# Patient Record
Sex: Female | Born: 1955 | Race: White | Hispanic: No | Marital: Married | Smoking: Never smoker
Health system: Southern US, Community
[De-identification: ages and names within clinical notes are randomized; demographics above are authoritative.]

## PROBLEM LIST (undated history)

## (undated) DIAGNOSIS — Z1231 Encounter for screening mammogram for malignant neoplasm of breast: Secondary | ICD-10-CM

---

## 2020-09-24 ENCOUNTER — Other Ambulatory Visit: Payer: Self-pay

## 2020-09-24 ENCOUNTER — Ambulatory Visit (INDEPENDENT_AMBULATORY_CARE_PROVIDER_SITE_OTHER): Payer: Medicare Other

## 2020-09-24 ENCOUNTER — Encounter: Payer: Self-pay | Admitting: Family Medicine

## 2020-09-24 ENCOUNTER — Ambulatory Visit (INDEPENDENT_AMBULATORY_CARE_PROVIDER_SITE_OTHER): Payer: Medicare Other | Admitting: Family Medicine

## 2020-09-24 ENCOUNTER — Ambulatory Visit: Payer: Self-pay

## 2020-09-24 VITALS — BP 114/72 | HR 63 | Ht 67.0 in | Wt 155.0 lb

## 2020-09-24 DIAGNOSIS — M25561 Pain in right knee: Secondary | ICD-10-CM | POA: Diagnosis not present

## 2020-09-24 DIAGNOSIS — S83241A Other tear of medial meniscus, current injury, right knee, initial encounter: Secondary | ICD-10-CM | POA: Diagnosis not present

## 2020-09-24 DIAGNOSIS — G8929 Other chronic pain: Secondary | ICD-10-CM | POA: Diagnosis not present

## 2020-09-24 NOTE — Progress Notes (Signed)
[facility] [address] [place] [address] Phone: [phone] Subjective:   I Jody Sutton am serving as a Neurosurgeon for Dr. Antoine Sutton.  This visit occurred during the SARS-CoV-2 public health emergency.  Safety protocols were in place, including screening questions prior to the visit, additional usage of staff PPE, and extensive cleaning of exam room while observing appropriate contact time as indicated for disinfecting solutions.   I'm seeing this patient by the request  of:  Jody Course, PA-C  CC: Right knee pain  [id number]  Jody Sutton is a 65 y.o. female coming in with complaint of R knee pain. Patient states she was playing tennis when she landed hard on the right leg.   Onset- April Location - medial Duration-  Character- sharp Aggravating factors- worse with activity  Reliving factors-  Therapies tried- PT and other modalities  Severity- 8/10     No past medical history on file.  Social History   Socioeconomic History   Marital status: Married    Spouse name: Not on file   Number of children: Not on file   Years of education: Not on file   Highest education level: Not on file  Occupational History   Not on file  Tobacco Use   Smoking status: Not on file   Smokeless tobacco: Not on file  Substance and Sexual Activity   Alcohol use: Not on file   Drug use: Not on file   Sexual activity: Not on file  Other Topics Concern   Not on file  Social History Narrative   Not on file   Social Determinants of Health   Financial Resource Strain: Not on file  Food Insecurity: Not on file  Transportation Needs: Not on file  Physical Activity: Not on file  Stress: Not on file  Social Connections: Not on file   Not on File No family history on file. No current outpatient medications on file.   Reviewed prior external information including notes and imaging from  primary care provider As well as notes  that were available from care everywhere and other healthcare systems.  Past medical history, social, surgical and family history all reviewed in electronic medical record.  No pertanent information unless stated regarding to the chief complaint.   Review of Systems:  No headache, visual changes, nausea, vomiting, diarrhea, constipation, dizziness, abdominal pain, skin rash, fevers, chills, night sweats, weight loss, swollen lymph nodes, body aches, joint swelling, chest pain, shortness of breath, mood changes. POSITIVE muscle aches  Objective  Blood pressure 114/72, pulse 63, height 5\' 7"  (1.702 m), weight 155 lb (70.3 kg), SpO2 100 %.   General: No apparent distress alert and oriented x3 mood and affect normal, dressed appropriately.  HEENT: Pupils equal, extraocular movements intact  Respiratory: Patient's speak in full sentences and does not appear short of breath  Cardiovascular: No lower extremity edema, non tender, no erythema  Gait normal with good balance and coordination.  MSK: Right knee exam shows the patient does have tenderness to palpation over the medial joint line.  Patient does have some mild positive McMurray's noted.  Patient does have full range of motion noted.  Good strength of the knee noted.  No significant effusion noted.  Limited musculoskeletal ultrasound was performed and interpreted by   Limited ultrasound of patient's right knee shows the patient does have arthritic changes noted.  Mild to moderate of the patellofemoral joint and mild of the  medial joint space.  Patient does have a fairly large so medial meniscal tear noted.  Seems to expand posteriorly and does have some mild displacement posteriorly. 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

## 2020-09-24 NOTE — Patient Instructions (Addendum)
Good to see you Xray today Medial meniscal tear Pennsaid 2 times a day Ice 20 mins 2 times a day Exercises 3 times a week Xray on the way out Ok to try tennis in 3 weeks but no more than twice a week avoid twisting See me again in 5 weeks

## 2020-09-24 NOTE — Assessment & Plan Note (Signed)
Patient does have what appears to be a tear of the medial meniscus.  Discussed with patient though that there is a chance that patient can heal with conservative therapy.  Given home exercises, icing, with activity similar to discuss the next 6 to 8 weeks worsening pain will consider formal physical therapy as well as injection.  Patient has done physical therapy though in the past.  Other idea would be advanced imaging if necessary.

## 2020-10-28 NOTE — Progress Notes (Signed)
[facility] [address] [place] [address] Phone: [phone] Subjective:   Jody Sutton, am serving as a scribe for Dr. Antoine Primas.  This visit occurred during the SARS-CoV-2 public health emergency.  Safety protocols were in place, including screening questions prior to the visit, additional usage of staff PPE, and extensive cleaning of exam room while observing appropriate contact time as indicated for disinfecting solutions.    I'm seeing this patient by the request  of:  Clemencia Course, PA-C  CC: Right knee  [id number]  09/24/2020 Patient does have what appears to be a tear of the medial meniscus.  Discussed with patient though that there is a chance that patient can heal with conservative therapy.  Given home exercises, icing, with activity similar to discuss the next 6 to 8 weeks worsening pain will consider formal physical therapy as well as injection.  Patient has done physical therapy though in the past.  Other idea would be advanced imaging if necessary.  Update 10/29/2020 Jody Sutton is a 65 y.o. female coming in with complaint of R knee pain.  Patient was found to have a medial meniscal tear.  Patient states that she has not had much pain with linear movements. Swelling has subsided. Does feel like she has good strength as well.   Xray 09/24/2020 R knee  IMPRESSION: Mild tricompartmental degenerative changes.  No other abnormalities.     No past medical history on file. No past surgical history on file. Social History   Socioeconomic History   Marital status: Married    Spouse name: Not on file   Number of children: Not on file   Years of education: Not on file   Highest education level: Not on file  Occupational History   Not on file  Tobacco Use   Smoking status: Not on file   Smokeless tobacco: Not on file  Substance and Sexual Activity   Alcohol use: Not on file   Drug use: Not on file   Sexual  activity: Not on file  Other Topics Concern   Not on file  Social History Narrative   Not on file   Social Determinants of Health   Financial Resource Strain: Not on file  Food Insecurity: Not on file  Transportation Needs: Not on file  Physical Activity: Not on file  Stress: Not on file  Social Connections: Not on file   Not on File No family history on file. No current outpatient medications on file.   Reviewed prior external information including notes and imaging from  primary care provider As well as notes that were available from care everywhere and other healthcare systems.  Past medical history, social, surgical and family history all reviewed in electronic medical record.  No pertanent information unless stated regarding to the chief complaint.   Review of Systems:  No headache, visual changes, nausea, vomiting, diarrhea, constipation, dizziness, abdominal pain, skin rash, fevers, chills, night sweats, weight loss, swollen lymph nodes, body aches, joint swelling, chest pain, shortness of breath, mood changes. POSITIVE muscle aches  Objective  Blood pressure 116/80, pulse 60, height 5\' 7"  (1.702 m), weight 155 lb (70.3 kg), SpO2 99 %.   General: No apparent distress alert and oriented x3 mood and affect normal, dressed appropriately.  HEENT: Pupils equal, extraocular movements intact  Respiratory: Patient's speak in full sentences and does not appear short of breath  Cardiovascular: No lower extremity edema, non tender, no erythema  Gait normal  with good balance and coordination.  MSK: Knee exam shows very minimal discomfort over the medial joint space.  Patient has a negative McMurray's and a negative Thessaly's today.  Patient does have very mild lateral tracking of the patella noted and does have seem to have a trace effusion of the patellofemoral joint noted.  Limited musculoskeletal ultrasound was performed and interpreted by Judi Saa  Limited ultrasound of  patient's right knee shows the patient still has a trace effusion noted in the patellofemoral joint as well as mild narrowing.  Patient does also have a little of the medial meniscal tear but nondisplaced.  No significant surrounding hypoechoic changes at this time.  Medial and lateral joint space seems to be preserved. Impression: Medial meniscal tear but improvement in hypoechoic changes from previous exam.   Impression and Recommendations:     The above documentation has been reviewed and is accurate and complete Judi Saa, DO

## 2020-10-29 ENCOUNTER — Other Ambulatory Visit: Payer: Self-pay

## 2020-10-29 ENCOUNTER — Encounter: Payer: Self-pay | Admitting: Family Medicine

## 2020-10-29 ENCOUNTER — Ambulatory Visit: Payer: Self-pay

## 2020-10-29 ENCOUNTER — Ambulatory Visit (INDEPENDENT_AMBULATORY_CARE_PROVIDER_SITE_OTHER): Payer: Medicare Other | Admitting: Family Medicine

## 2020-10-29 VITALS — BP 116/80 | HR 60 | Ht 67.0 in | Wt 155.0 lb

## 2020-10-29 DIAGNOSIS — M25561 Pain in right knee: Secondary | ICD-10-CM | POA: Diagnosis not present

## 2020-10-29 DIAGNOSIS — S83241D Other tear of medial meniscus, current injury, right knee, subsequent encounter: Secondary | ICD-10-CM

## 2020-10-29 NOTE — Patient Instructions (Signed)
Making good strides Can increase activity with trainer Including squatting and mild twisting Bike or elliptical for cardio Ok to play in 2-3 weeks Follow up in 6 weeks

## 2020-10-29 NOTE — Assessment & Plan Note (Signed)
Patient does have moderate degenerative changes noted.  Discussed icing regimen and home exercises.  Patient has been working with a Systems analyst and can start to progress at this moment.  X-ray showed very mild tricompartmental degenerative changes.  Discussed with patient icing regimen and home exercises we will see if necessary.  Patient will follow up with me again in 6 weeks for further evaluation.

## 2020-12-09 NOTE — Progress Notes (Signed)
[facility] [address] [place] [address] Phone: [phone] Subjective:   INadine Sutton, am serving as a scribe for Dr. Antoine Primas. This visit occurred during the SARS-CoV-2 public health emergency.  Safety protocols were in place, including screening questions prior to the visit, additional usage of staff PPE, and extensive cleaning of exam room while observing appropriate contact time as indicated for disinfecting solutions.   I'm seeing this patient by the request  of:  Clemencia Course, PA-C  CC: Right knee pain follow-up  [id number]  10/29/2020 Patient does have moderate degenerative changes noted.  Discussed icing regimen and home exercises.  Patient has been working with a Systems analyst and can start to progress at this moment.  X-ray showed very mild tricompartmental degenerative changes.  Discussed with patient icing regimen and home exercises we will see if necessary.  Patient will follow up with me again in 6 weeks for further evaluation.  Updated 12/10/2020 Jody Sutton is a 65 y.o. female coming in with complaint of right knee pain. Even though it has gotten progressively better there is still some pain. She hasn't played tennis, but has been participating in classes where she is jumping and including lateral motion and there is little to no pain with that. She is irritated over the patellar tendon.      No past medical history on file. No past surgical history on file. Social History   Socioeconomic History   Marital status: Married    Spouse name: Not on file   Number of children: Not on file   Years of education: Not on file   Highest education level: Not on file  Occupational History   Not on file  Tobacco Use   Smoking status: Not on file   Smokeless tobacco: Not on file  Substance and Sexual Activity   Alcohol use: Not on file   Drug use: Not on file   Sexual activity: Not on file  Other Topics  Concern   Not on file  Social History Narrative   Not on file   Social Determinants of Health   Financial Resource Strain: Not on file  Food Insecurity: Not on file  Transportation Needs: Not on file  Physical Activity: Not on file  Stress: Not on file  Social Connections: Not on file   Not on File No family history on file. No current outpatient medications on file.   Review of Systems:  No headache, visual changes, nausea, vomiting, diarrhea, constipation, dizziness, abdominal pain, skin rash, fevers, chills, night sweats, weight loss, swollen lymph nodes, body aches, joint swelling, chest pain, shortness of breath, mood changes. POSITIVE muscle aches  Objective  Blood pressure 116/70, height 5\' 7"  (1.702 m), weight 153 lb (69.4 kg), SpO2 99 %.   General: No apparent distress alert and oriented x3 mood and affect normal, dressed appropriately.  HEENT: Pupils equal, extraocular movements intact  Respiratory: Patient's speak in full sentences and does not appear short of breath  Cardiovascular: No lower extremity edema, non tender, no erythema  Gait normal with good balance and coordination.  MSK: Patient's right knee still has some very mild positive McMurray's and some mild pain over the medial joint space.  Patient has full range of motion no noted.  5 out of 5 strength in lower extremity.  No abnormality to gait noted.    Impression and Recommendations:    The above documentation has been reviewed and is accurate and complete  Lyndal Pulley, DO

## 2020-12-10 ENCOUNTER — Ambulatory Visit (INDEPENDENT_AMBULATORY_CARE_PROVIDER_SITE_OTHER): Payer: Medicare Other | Admitting: Family Medicine

## 2020-12-10 ENCOUNTER — Other Ambulatory Visit: Payer: Self-pay

## 2020-12-10 DIAGNOSIS — S83241D Other tear of medial meniscus, current injury, right knee, subsequent encounter: Secondary | ICD-10-CM

## 2020-12-10 NOTE — Assessment & Plan Note (Signed)
Patient overall improvement noted.  Seems to be limited in degenerative meniscal tear.  Still trace effusion noted of the patellofemoral joint.  Patient though will continue to monitor for the some of the jumping and twisting motions.  Patient will follow up with me again in 7 to 8 weeks but can start playing tennis in the next 2 to 3 weeks.

## 2020-12-10 NOTE — Patient Instructions (Signed)
Be careful with lunges jumping twisting See me again in 7-8 weeks

## 2021-02-04 ENCOUNTER — Ambulatory Visit: Payer: Medicare Other | Admitting: Family Medicine

## 2021-02-09 NOTE — Progress Notes (Signed)
[name] [address] [place] [address] Phone: [phone] Subjective:   Jody [facility], am serving as a scribe for Dr. Antoine Primas. This visit occurred during the SARS-CoV-2 public health emergency.  Safety protocols were in place, including screening questions prior to the visit, additional usage of staff PPE, and extensive cleaning of exam room while observing appropriate contact time as indicated for disinfecting solutions.   I'm seeing this patient by the request  of:  Clemencia Course, PA-C  CC: Right knee pain follow-up  [id number]  12/10/2020 Patient overall improvement noted.  Seems to be limited in degenerative meniscal tear.  Still trace effusion noted of the patellofemoral joint.  Patient though will continue to monitor for the some of the jumping and twisting motions.  Patient will follow up with me again in 7 to 8 weeks but can start playing tennis in the next 2 to 3 weeks.  Updated 02/10/2021 Jody Sutton is a 65 y.o. female coming in with complaint of knee pain.  Was found to have more of a degenerative meniscal tear as well as trace effusion of the patellofemoral joint.  Patient was to slowly start increasing activity including her tenderness about 1 month ago.  The patient states getting progressively better. Able to play tennis. Only hurts when doing some household chores. No new complaints.      No past medical history on file. No past surgical history on file. Social History   Socioeconomic History   Marital status: Married    Spouse name: Not on file   Number of children: Not on file   Years of education: Not on file   Highest education level: Not on file  Occupational History   Not on file  Tobacco Use   Smoking status: Not on file   Smokeless tobacco: Not on file  Substance and Sexual Activity   Alcohol use: Not on file   Drug use: Not on file   Sexual activity: Not on file  Other Topics Concern    Not on file  Social History Narrative   Not on file   Social Determinants of Health   Financial Resource Strain: Not on file  Food Insecurity: Not on file  Transportation Needs: Not on file  Physical Activity: Not on file  Stress: Not on file  Social Connections: Not on file   Not on File No family history on file. No current outpatient medications on file.   Reviewed prior external information including notes and imaging from  primary care provider As well as notes that were available from care everywhere and other healthcare systems.  Past medical history, social, surgical and family history all reviewed in electronic medical record.  No pertanent information unless stated regarding to the chief complaint.   Review of Systems:  No headache, visual changes, nausea, vomiting, diarrhea, constipation, dizziness, abdominal pain, skin rash, fevers, chills, night sweats, weight loss, swollen lymph nodes, body aches, joint swelling, chest pain, shortness of breath, mood changes. POSITIVE muscle aches  Objective  Height 5\' 7"  (1.702 m), weight 155 lb (70.3 kg).   General: No apparent distress alert and oriented x3 mood and affect normal, dressed appropriately.  HEENT: Pupils equal, extraocular movements intact  Respiratory: Patient's speak in full sentences and does not appear short of breath  Cardiovascular: No lower extremity edema, non tender, no erythema  Gait normal with good balance and coordination.  MSK: Right knee exam shows good range of motion with  very mild crepitus noted of the patella.  Patient is nontender over the medial joint line.  Negative McMurray's noted today.    Impression and Recommendations:    The above documentation has been reviewed and is accurate and complete Judi Saa, DO

## 2021-02-10 ENCOUNTER — Other Ambulatory Visit: Payer: Self-pay

## 2021-02-10 ENCOUNTER — Ambulatory Visit (INDEPENDENT_AMBULATORY_CARE_PROVIDER_SITE_OTHER): Payer: Medicare Other | Admitting: Family Medicine

## 2021-02-10 ENCOUNTER — Encounter: Payer: Self-pay | Admitting: Family Medicine

## 2021-02-10 DIAGNOSIS — S83241D Other tear of medial meniscus, current injury, right knee, subsequent encounter: Secondary | ICD-10-CM

## 2021-02-10 NOTE — Patient Instructions (Signed)
No more vacuuming No walking lunges but can do reverse lunges Careful with jumping Wear brace with activity for one month See me in 2-3 months

## 2021-02-10 NOTE — Assessment & Plan Note (Signed)
Patient has had this for over 4 months at this time but has finally progress.  Patient is able to increase activity as tolerated.  Trace effusion still noted of the patella joint but do not think that this would change management.  Patient will follow-up with me again in 2 to 3 months if not completely resolved.

## 2021-03-17 ENCOUNTER — Ambulatory Visit
Admission: EM | Admit: 2021-03-17 | Discharge: 2021-03-17 | Disposition: A | Discharge status: Home / Self Care | Payer: Medicare Other | Attending: Emergency Medicine | Admitting: Emergency Medicine

## 2021-03-17 ENCOUNTER — Other Ambulatory Visit: Payer: Self-pay

## 2021-03-17 DIAGNOSIS — R102 Pelvic and perineal pain: Secondary | ICD-10-CM | POA: Diagnosis present

## 2021-03-17 DIAGNOSIS — R300 Dysuria: Secondary | ICD-10-CM | POA: Diagnosis present

## 2021-03-17 LAB — POCT URINALYSIS DIP (MANUAL ENTRY)
Bilirubin, UA: NEGATIVE
Blood, UA: NEGATIVE
Glucose, UA: NEGATIVE mg/dL
Ketones, POC UA: NEGATIVE mg/dL
Leukocytes, UA: NEGATIVE
Nitrite, UA: NEGATIVE
Protein Ur, POC: NEGATIVE mg/dL
Spec Grav, UA: <=1.005 — AB (ref 1.010–1.025)
Urobilinogen, UA: 0.2 E.U./dL
pH, UA: 5.5 (ref 5.0–8.0)

## 2021-03-17 MED ORDER — SULFAMETHOXAZOLE-TRIMETHOPRIM 800-160 MG PO TABS: 1.0000 | ORAL_TABLET | Freq: Two times a day (BID) | ORAL | 0 refills | Status: AC

## 2021-03-17 NOTE — ED Provider Notes (Signed)
[facility]    CSN: [id number] Arrival date & time: 03/17/21  1534    HISTORY  No chief complaint on file.  HPI Jody Sutton is a 65 y.o. female. Pt reports lower abd and back pain for the past week, she denies having any vaginal symptoms.  Patient states she recently got back from a cruise, states that 1 week ago she went paddle boarding in the swimming pool for about 30 minutes, states morning is when she first noticed that she was having some lower abdominal pain.  Patient states she also noticed that she was going to the bathroom more frequently, denies increased urgency, incomplete emptying, burning with urination.  Patient denies a history of frequent urinary tract infections.  Patient states her uterus is still intact.  She states she has no significant gynecological history such as uterine fibroids or abnormal vaginal bleeding.  Patient denies fullness sensation in her lower abdomen.  Patient denies history of constipation.  The history is provided by the patient.  History reviewed. No pertinent past medical history. Patient Active Problem List   Diagnosis Date Noted   Acute medial meniscus tear of right knee 09/24/2020   History reviewed. No pertinent surgical history. OB History   No obstetric history on file.    Home Medications    Prior to Admission medications   Not on File   Family History History reviewed. No pertinent family history. Social History Social History   Tobacco Use   Smoking status: Never   Smokeless tobacco: Never  Substance Use Topics   Alcohol use: Yes   Drug use: Never   Allergies   Aspirin, Ibuprofen, Cefdinir, and Penicillin g  Review of Systems Review of Systems Pertinent findings noted in history of present illness.   Physical Exam Triage Vital Signs ED Triage Vitals  Enc Vitals Group     BP 01/30/21 0827 (!) 147/82     Pulse Rate 01/30/21 0827 72     Resp 01/30/21 0827 18     Temp 01/30/21 0827 98.3 F (36.8  C)     Temp Source 01/30/21 0827 Oral     SpO2 01/30/21 0827 98 %     Weight --      Height --      Head Circumference --      Peak Flow --      Pain Score 01/30/21 0826 5     Pain Loc --      Pain Edu? --      Excl. in GC? --   No data found.  Updated Vital Signs BP (!) 155/80 (BP Location: Right Arm)    Pulse 67    Temp 98.5 F (36.9 C) (Oral)    Resp 18    SpO2 98%   Physical Exam Vitals and nursing note reviewed.  Constitutional:      General: She is not in acute distress.    Appearance: Normal appearance. She is not ill-appearing.  HENT:     Head: Normocephalic and atraumatic.  Eyes:     General: Lids are normal.        Right eye: No discharge.        Left eye: No discharge.     Extraocular Movements: Extraocular movements intact.     Conjunctiva/sclera: Conjunctivae normal.     Right eye: Right conjunctiva is not injected.     Left eye: Left conjunctiva is not injected.  Neck:     Trachea: Trachea and phonation normal.  Cardiovascular:     Rate and Rhythm: Normal rate and regular rhythm.     Pulses: Normal pulses.     Heart sounds: Normal heart sounds. No murmur heard.   No friction rub. No gallop.  Pulmonary:     Effort: Pulmonary effort is normal. No accessory muscle usage, prolonged expiration or respiratory distress.     Breath sounds: Normal breath sounds. No stridor, decreased air movement or transmitted upper airway sounds. No decreased breath sounds, wheezing, rhonchi or rales.  Chest:     Chest wall: No tenderness.  Abdominal:     General: Abdomen is flat. Bowel sounds are normal. There is no distension.     Palpations: Abdomen is soft.     Tenderness: There is abdominal tenderness in the suprapubic area. There is no right CVA tenderness or left CVA tenderness.     Hernia: No hernia is present.  Musculoskeletal:        General: Normal range of motion.     Cervical back: Normal range of motion and neck supple. Normal range of motion.   Lymphadenopathy:     Cervical: No cervical adenopathy.  Skin:    General: Skin is warm and dry.     Findings: No erythema or rash.  Neurological:     General: No focal deficit present.     Mental Status: She is alert and oriented to person, place, and time.  Psychiatric:        Mood and Affect: Mood normal.        Behavior: Behavior normal.    Visual Acuity Right Eye Distance:   Left Eye Distance:   Bilateral Distance:    Right Eye Near:   Left Eye Near:    Bilateral Near:     UC Couse / Diagnostics / Procedures:    EKG  Radiology No results found.  Procedures Procedures (including critical care time)  UC Diagnoses / Final Clinical Impressions(s)   I have reviewed the triage vital signs and the nursing notes.  Pertinent labs & imaging results that were available during my care of the patient were reviewed by me and considered in my medical decision making (see chart for details).    Final diagnoses:  Dysuria  Suprapubic pain   Urine dip today was unremarkable.  Urine culture will be performed per our protocol.  Patient advised that the results of her urine culture will be posted to her MyChart account and that if there are any abnormal findings she will be contacted by phone.  Patient advised that I recommend a trial of Bactrim to see if her symptoms significantly improve over the next 24 to 36 hours.  Patient advised that if they do not she should complete at least 3 days of Bactrim and assume that her pain is secondary to either musculoskeletal soreness or possible gynecological issue.  Patient advised to go ahead and preemptively schedule appointment with her gynecologist for full pelvic exam, patient advised she can discontinue if no longer needed but better to have it and not needed then to needed and not have it.  Patient advised that if her symptoms do significantly improve then she should continue a full 5-day course of Bactrim.  ED Prescriptions      Medication Sig Dispense Auth. Provider   sulfamethoxazole-trimethoprim (BACTRIM DS) 800-160 MG tablet Take 1 tablet by mouth 2 (two) times daily for 5 days. 10 tablet Theadora Rama Scales, PA-C      PDMP not reviewed this encounter.  Pending results:  Labs Reviewed  POCT URINALYSIS DIP (MANUAL ENTRY) - Abnormal; Notable for the following components:      Result Value   Spec Grav, UA <=1.005 (*)    All other components within normal limits  URINE CULTURE    Medications Ordered in UC: Medications - No data to display  Disposition Upon Discharge:  Condition: stable for discharge home Home: take medications as prescribed; routine discharge instructions as discussed; follow up as advised.  Patient presented with an acute illness with associated systemic symptoms and significant discomfort requiring urgent management. In my opinion, this is a condition that a prudent lay person (someone who possesses an average knowledge of health and medicine) may potentially expect to result in complications if not addressed urgently such as respiratory distress, impairment of bodily function or dysfunction of bodily organs.   Routine symptom specific, illness specific and/or disease specific instructions were discussed with the patient and/or caregiver at length.   As such, the patient has been evaluated and assessed, work-up was performed and treatment was provided in alignment with urgent care protocols and evidence based medicine.  Patient/parent/caregiver has been advised that the patient may require follow up for further testing and treatment if the symptoms continue in spite of treatment, as clinically indicated and appropriate.  Patient/parent/caregiver has been advised to return to the [facility] or PCP in 3-5 days if no better; to PCP or the Emergency Department if new signs and symptoms develop, or if the current signs or symptoms continue to change or worsen for further workup, evaluation and  treatment as clinically indicated and appropriate  The patient will follow up with their current PCP if and as advised. If the patient does not currently have a PCP we will assist them in obtaining one.   The patient may need specialty follow up if the symptoms continue, in spite of conservative treatment and management, for further workup, evaluation, consultation and treatment as clinically indicated and appropriate.  Patient/parent/caregiver verbalized understanding and agreement of plan as discussed.  All questions were addressed during visit.  Please see discharge instructions below for further details of plan.  Discharge Instructions:   Discharge Instructions      The urinalysis that we performed today was abnormal.  Please begin antibiotics now.  Urine culture will be performed per our protocol.  The results of urine culture should be available in the next 3 to 5 days and will be posted to your MyChart account.  If there are any abnormal results, you will be contacted by phone and if further treatment is needed, this will be provided for you.  If you were advised to begin antibiotics today, it is because you are having active symptoms of a urinary tract infection.  It is important that you complete the course of antibiotics as prescribed despite the results of your urine culture.    The most common reason for a negative urine culture, despite having active symptoms of urinary tract infection, is that a truly reliable urine sample can only be obtained with the first urination of the day.  That early morning urine sample is very concentrated so it is most likely to have a significant amount of bacteria, enough to provide a positive urine culture.  Throughout the day, as you drink fluids and consume foods, your urine becomes more diluted.  Diluted urine decreases the likelihood of a negative urine culture.  Therefore, if you begin to have significant relief of your symptoms when you begin the  antibiotics but receive a phone call stating that your urine culture was negative, please trust the improvement of your symptoms as a sign that the antibiotics have been effective and that you should complete the entire course.  For your pain, I recommend that you begin taking Tylenol for 1000 mg 3 times daily or every 8 hours.  This typically is not effective for the first few doses but by the third dose does provide significant pain relief.  If your pain is musculoskeletal in origin and not due to urinary tract infection, you should have significant relief.         Theadora Rama Scales, PA-C 03/17/21 1742

## 2021-03-17 NOTE — ED Triage Notes (Signed)
Pt reports lower abd and back pain, she denies having any vaginal symptoms.  Started:  a week ago

## 2021-03-17 NOTE — Discharge Instructions (Addendum)
The urinalysis that we performed today was abnormal.  Please begin antibiotics now.  Urine culture will be performed per our protocol.  The results of urine culture should be available in the next 3 to 5 days and will be posted to your MyChart account.  If there are any abnormal results, you will be contacted by phone and if further treatment is needed, this will be provided for you.  If you were advised to begin antibiotics today, it is because you are having active symptoms of a urinary tract infection.  It is important that you complete the course of antibiotics as prescribed despite the results of your urine culture.    The most common reason for a negative urine culture, despite having active symptoms of urinary tract infection, is that a truly reliable urine sample can only be obtained with the first urination of the day.  That early morning urine sample is very concentrated so it is most likely to have a significant amount of bacteria, enough to provide a positive urine culture.  Throughout the day, as you drink fluids and consume foods, your urine becomes more diluted.  Diluted urine decreases the likelihood of a negative urine culture.  Therefore, if you begin to have significant relief of your symptoms when you begin the antibiotics but receive a phone call stating that your urine culture was negative, please trust the improvement of your symptoms as a sign that the antibiotics have been effective and that you should complete the entire course.  For your pain, I recommend that you begin taking Tylenol for 1000 mg 3 times daily or every 8 hours.  This typically is not effective for the first few doses but by the third dose does provide significant pain relief.  If your pain is musculoskeletal in origin and not due to urinary tract infection, you should have significant relief.

## 2021-03-19 LAB — URINE CULTURE: Culture: NO GROWTH

## 2021-04-30 ENCOUNTER — Ambulatory Visit: Payer: Medicare Other | Admitting: Family Medicine

## 2022-03-01 ENCOUNTER — Ambulatory Visit
Admit: 2022-03-01 | Discharge: 2022-03-01 | Payer: MEDICARE | Primary: Student in an Organized Health Care Education/Training Program

## 2022-03-01 DIAGNOSIS — R0602 Shortness of breath: Secondary | ICD-10-CM

## 2022-03-01 NOTE — Telephone Encounter (Signed)
Patient moved from [facility] and had her PFT today in office. Has first appt 04/16/22. Needs Flovent 152mcg refilled. She will run out prior to appt.

## 2022-03-02 MED ORDER — FLUTICASONE PROPIONATE HFA 110 MCG/ACT IN AERO
110 MCG/ACT | Freq: Two times a day (BID) | RESPIRATORY_TRACT | 3 refills | Status: AC
Start: 2022-03-02 — End: 2023-03-02

## 2022-03-03 MED ORDER — FLOVENT DISKUS 100 MCG/ACT IN AEPB
100 MCG/ACT | Freq: Two times a day (BID) | RESPIRATORY_TRACT | 5 refills | Status: AC
Start: 2022-03-03 — End: 2022-03-06

## 2022-03-03 NOTE — Telephone Encounter (Signed)
Per [facility] the Flovent HFA is not available.   I called [facility] and they also advise it is not available to order. They do say they can get the Flovent Diskus 100 if we want to send a Rx in for the patient.

## 2022-03-03 NOTE — Telephone Encounter (Signed)
Per verbal order from Dr. Lyda Perone ok to change to Flovent Diskus 100

## 2022-03-05 NOTE — Telephone Encounter (Signed)
Pt returning nurse call. Please call [phone]

## 2022-03-05 NOTE — Telephone Encounter (Signed)
Duplicate message

## 2022-03-05 NOTE — Telephone Encounter (Signed)
Pt would like to speak with nurse in regards to medication d/t [facility] does not contract with [facility] and [facility]. Please advise [phone]

## 2022-03-06 MED ORDER — FLOVENT DISKUS 100 MCG/ACT IN AEPB
100 MCG/ACT | Freq: Two times a day (BID) | RESPIRATORY_TRACT | 1 refills | Status: AC
Start: 2022-03-06 — End: 2022-03-22

## 2022-03-22 MED ORDER — FLOVENT DISKUS 100 MCG/ACT IN AEPB
100 MCG/ACT | Freq: Two times a day (BID) | RESPIRATORY_TRACT | 1 refills | Status: DC
Start: 2022-03-22 — End: 2022-04-16

## 2022-03-22 NOTE — Telephone Encounter (Signed)
Patient is stating that she hasn't heard from [facility] regarding Flovent Inhaler.  Pt is requesting if the Rx can be sent to [facility] in [place].  Pt contact# 1 [phone]

## 2022-04-16 ENCOUNTER — Encounter
Admit: 2022-04-16 | Discharge: 2022-04-16 | Payer: MEDICARE | Attending: Internal Medicine | Primary: Student in an Organized Health Care Education/Training Program

## 2022-04-16 DIAGNOSIS — J4540 Moderate persistent asthma, uncomplicated: Secondary | ICD-10-CM

## 2022-04-16 MED ORDER — FLUTICASONE-SALMETEROL 230-21 MCG/ACT IN AERO
230-21 MCG/ACT | Freq: Two times a day (BID) | RESPIRATORY_TRACT | 5 refills | Status: DC
Start: 2022-04-16 — End: 2022-07-21

## 2022-04-16 MED ORDER — FLUTICASONE PROPIONATE 50 MCG/ACT NA SUSP
50 MCG/ACT | Freq: Every day | NASAL | 0 refills | Status: DC
Start: 2022-04-16 — End: 2022-07-21

## 2022-04-16 NOTE — Patient Instructions (Addendum)
What is the plan?  -Start Advair - rinse mouth after each use  -Stop Flovent  -Continue Albuterol rescue inhaler as needed  -complete labwork

## 2022-04-16 NOTE — Progress Notes (Signed)
Kristina Crawford presents today for   Chief Complaint   Patient presents with    New Patient     Self ref    Shortness of Breath       Is someone accompanying this pt? No    Is the patient using any DME equipment during OV? No    -DME Company NA    Depression Screening:         No data to display                Learning Needs Questionnaire:     No question data found.      Fall Risk:          No data to display                 Abuse Screening:          No data to display                  Coordination of Care:    1. Have you been to the ER, urgent care clinic since your last visit? Hospitalized since your last visit? No    2. Have you seen or consulted any other health care providers outside of the [place] since your last visit? Include any pap smears or colon screening. No    Medication list has been update per patient.

## 2022-04-16 NOTE — Progress Notes (Signed)
[facility]                                                       Pulmonary, Critical Care, and Sleep Medicine      Pulmonary Office Initial Consultation.    Name: Kristina Crawford     DOB: 04/23/1955     Date: 04/16/2022        Subjective:   Chief complaint: SOB  Patient is a 67 y.o. female with a PMHx of Asthma    HPI (04/16/22):  Today in clinic, she states that she was diagnoed with Asthma 20 years ago after moving to [place] due to bronchospasm while running - started on Albuterol rescue inhaler.  Noted increase in symptoms while in [place]. She was then started on Advair and Albuterol which lead to improvement in her symptoms.  She then morved to [place] - she was then switched to [employer]. No issues with her breathing.  Notes since moving to [place] - she has noted worsening symptoms.  Dyspnea: Able to carry out all of her activities without dyspnea. Able to walk, ride her bike and play tennis.Only restricted when she is getting a cold/URI. Breathing worse at night (10pm). No episodes of waking up with dyspnea.   Cough: none now.   Phlegm and/or sputum production: none  Hemoptysis: no  Was using rescue inhaler more in December, 2023.   No rashes/eczema patches.  ASA - lead eye swelling.  No acid reflux.  No family history of Asthma. No history of lung cancer in family.    Current inhalers and/or respiratory treatments:  -Flovent 110 mcg - using 1-2 puff BID; rinsing mouth after use. Did not like the discus.  -Albuterol rescue inhaler - have not used in the past few weeks. Feels like it provides relief all day.  -Singulair - previously on this but stopped as she did not want to stay on the medication.    Prior inhalers and/ or respiratory treatments:  -Advair, Albuterol     Allergic rhinitis: new symptoms. Have Allegra and it did provide some relief. Notes ongoing rhinitis.  Respiratory childhood illness:  none    Tobacco/Substance/Vape/Hookah: never smoker. No MJ, vape or hookah use.  Occupation: homemaker; previously in [employer].  Travel: Dec- [place]; [place]  TB / TB exposure/testing or TB treatment: none  VTE/DVT history: none  Autoimmune disease: none; Mother with RA  Influenza vaccination: declined today.  COVID-19 (+) testing: (+)COVID-19 - Jan, 2023 -> tired, runny nose & sore throat.  COVID-19 vaccination: declined.    Home environment:  -Water leaks / mold: none  -Pets: dog - no issues with allergies  -Infestation: none  -Repairs / remodeling: none  -Flooring: carpet - just moved into home    Sleep - denies snoring or witnessed apnea.      No past surgical history on file.     Social History     Socioeconomic History    Marital status: Married     Spouse name: None    Number of children: None    Years of education: None    Highest education level: None   Tobacco Use    Smoking status: Never    Smokeless tobacco: Never        No family history on file.  No Known Allergies     Current Outpatient Medications   Medication Sig Dispense Refill    fluticasone proprionate (FLOVENT DISKUS) 100 MCG/ACT AEPB Inhale 1 puff into the lungs in the morning and at bedtime 3 each 1     No current facility-administered medications for this visit.        Review of Systems:  HEENT: No epistaxis, no nasal drainage, no difficulty in swallowing, no redness in eyes  Respiratory: as above  Cardiovascular: no chest pain, no palpitations, no chronic leg edema, no syncope  Gastrointestinal: no abd pain, no vomiting, no diarrhea, no bleeding symptoms  Genitourinary: No urinary symptoms or hematuria  Integument/breast: No ulcers or rashes  Musculoskeletal:Neg  Neurological: No focal weakness, no seizures, no headaches  Behvioral/Psych: No anxiety, no depression  Constitutional: as above         No data to display                      No data to display                 Objective:   BP 135/64 (Site: Left Upper Arm,  Position: Sitting, Cuff Size: Large Adult)   Pulse 68   Temp 97.1 F (36.2 C) (Temporal)   Resp 16   Ht 1.702 m (5\' 7" )   Wt 70.7 kg (155 lb 12.8 oz)   SpO2 100%   BMI 24.40 kg/m      Physical Exam:   General: comfortable, no acute distress  HEENT: pupils reactive, sclera anicteric, EOM intact, moist mucus membranes.   Neck: No adenopathy or thyroid swelling, no lymphadenopathy or JVD, supple  CVS: S1S2 no murmurs  RS: CTA, no tactile fremitus or egophony, no accessory muscle use  Abd: soft, non tender, no hepatosplenomegaly, BS normal  Neuro: non focal, awake, alert  Extrm: no leg edema, clubbing or cyanosis  Skin: no rash on exposed skin    Data review:   Pertinent labs: CBC, BMP, LFT's - not available  Serologies (ILD, ANA): n/a  IgE: n/a  Peripheral Eos: n/a  Allergy panel: n/a  Alpha-1 antitrypsin: n/a  Phenotype: n/a  ACE level: n/a  HP panel: n/a    PFT:  Date_____FEV1______FVC___FEV1/FVC___BDFEV1___BDFVC___pstBDratio__FEV1/SVC  02/2022....1.21(46%).....2.22(64%)...12/2023Marland Kitchen55%......1.32(50%).....2.35(68%).....56%...................55%    Date_____TLC_________RV________RV/TLC  02/2022....4.9(87%).....2.72(126%)...12/2023Marland Kitchen55%    Date_____DLCO  02/2022....24.36(99%)       Six Minute Walk Test: n/a    Echocardiogram  Date: No results found for this or any previous visit.      Imaging:  I have personally reviewed the patient's radiographs and have reviewed the reports:  XR Results (most recent):  N/A      CT Results (most recent):  N/A       There is no problem list on file for this patient.      IMPRESSION:   Asthma: moderate, persistent. Suspect allergic subtype. PFT with moderate partially reversible obstructive defect and gas trapping. No prior labwork.  Allergic rhinitis with post-nasal drip  H/o Aspirin & Ibuprofen allergy  H/o COVID-19 viral infection: Jan, 2024  Comorbid conditions: as above      RECOMMENDATIONS:   PFT discussed in clinic today  Start on Advair 231/21 mcg. 2 puff BID; rinse mouth after each  use  Continue Albuterol rescue inhaler as needed  Stop Flovent  Start on Flonase nasal spray  Will obtain chest imaging upon re-evaluation  May continue on Allegra  Labwork - IgE, CBC w/ diff, and Allergen panel  May obtain  chest imaging pending response to inhaler therapy.  Avoidance of triggers discussed  Vaccination: Recommend all age-appropriate immunizations including influenza, RSV and COVID-19 vaccination.  Follow up - 3 months & or sooner should new symptoms or problems arise.       Education:  Inhaler techniques, MDI/spacers, nebulizers use, goal setting, monitoring  Reducing anxiety, energy conservation tips, exercise, medications and Nutrition      Health maintenance screens deferred to Primary care provider.     Kristina Crawford T Shiri Hodapp, DO  04/16/2022  Pulmonary, Critical Care Medicine  [facility]

## 2022-06-01 ENCOUNTER — Encounter

## 2022-06-18 ENCOUNTER — Encounter

## 2022-06-18 ENCOUNTER — Inpatient Hospital Stay: Admit: 2022-06-18 | Payer: MEDICARE | Primary: Student in an Organized Health Care Education/Training Program

## 2022-06-18 DIAGNOSIS — Z1231 Encounter for screening mammogram for malignant neoplasm of breast: Secondary | ICD-10-CM

## 2022-07-21 ENCOUNTER — Ambulatory Visit
Admit: 2022-07-21 | Discharge: 2022-07-21 | Payer: MEDICARE | Attending: Internal Medicine | Primary: Student in an Organized Health Care Education/Training Program

## 2022-07-21 DIAGNOSIS — J4540 Moderate persistent asthma, uncomplicated: Secondary | ICD-10-CM

## 2022-07-21 MED ORDER — FLUTICASONE-SALMETEROL 230-21 MCG/ACT IN AERO
230-21 | Freq: Two times a day (BID) | RESPIRATORY_TRACT | 5 refills | Status: AC
Start: 2022-07-21 — End: ?

## 2022-07-21 MED ORDER — FLUTICASONE-SALMETEROL 115-21 MCG/ACT IN AERO
115-21 | Freq: Two times a day (BID) | RESPIRATORY_TRACT | 5 refills | Status: AC
Start: 2022-07-21 — End: ?

## 2022-07-21 MED ORDER — FLUTICASONE PROPIONATE 50 MCG/ACT NA SUSP
50 | Freq: Every day | NASAL | 0 refills | Status: AC
Start: 2022-07-21 — End: ?

## 2022-07-21 NOTE — Progress Notes (Unsigned)
[facility]                                                       Pulmonary, Critical Care, and Sleep Medicine      Pulmonary Office Follow-up.    Name: Kristina Crawford     DOB: 23-Jun-1955     Date: 07/21/2022        Subjective:   Chief complaint: SOB  Patient is a 67 y.o. female with a PMHx of Asthma      HPI(07/21/22):  Today in clinic, ***he/she states ***    She states she is feeling quite well.   No issues with  No steroids or ED visits.  No night sweats.  No night time symptoms.  Able to carryout her exercise without difficulty.  No change in voice, throat discomfort and hoarseness.      Seen by ENT - started on Allegra. Has allergy testing later today. Continuing on Flonase.      Current inhalers and/or respiratory treatments:  -Advair - using once daily.   -Albuterol rescue inhaler - have not used since Feb, 2024.  -Singulair - not on this.    Prior inhalers and/or respiratory treatments:  -Flovent.      HPI (04/16/22):  Today in clinic, she states that she was diagnoed with Asthma 20 years ago after moving to [place] due to bronchospasm while running - started on Albuterol rescue inhaler.  Noted increase in symptoms while in [place]. She was then started on Advair and Albuterol which lead to improvement in her symptoms.  She then morved to [place] - she was then switched to [employer]. No issues with her breathing.  Notes since moving to [place] - she has noted worsening symptoms.  Dyspnea: Able to carry out all of her activities without dyspnea. Able to walk, ride her bike and play tennis.Only restricted when she is getting a cold/URI. Breathing worse at night (10pm). No episodes of waking up with dyspnea.   Cough: none now.   Phlegm and/or sputum production: none  Hemoptysis: no  Was using rescue inhaler more in December, 2023.   No rashes/eczema patches.  ASA - lead eye swelling.  No acid reflux.  No family history of Asthma. No history  of lung cancer in family.    Current inhalers and/or respiratory treatments:  -Flovent 110 mcg - using 1-2 puff BID; rinsing mouth after use. Did not like the discus.  -Albuterol rescue inhaler - have not used in the past few weeks. Feels like it provides relief all day.  -Singulair - previously on this but stopped as she did not want to stay on the medication.    Prior inhalers and/ or respiratory treatments:  -Advair, Albuterol     Allergic rhinitis: new symptoms. Have Allegra and it did provide some relief. Notes ongoing rhinitis.  Respiratory childhood illness: none    Tobacco/Substance/Vape/Hookah: never smoker. No MJ, vape or hookah use.  Occupation: homemaker; previously in [employer].  Travel: Dec- [place]; [place]  TB / TB exposure/testing or TB treatment: none  VTE/DVT history: none  Autoimmune disease: none; Mother with RA  Influenza vaccination: declined today.  COVID-19 (+) testing: (+)COVID-19 - Jan, 2023 -> tired, runny nose & sore throat.  COVID-19 vaccination: declined.    Home environment:  -Water leaks /  mold: none  -Pets: dog - no issues with allergies  -Infestation: none  -Repairs / remodeling: none  -Flooring: carpet - just moved into home    Sleep - denies snoring or witnessed apnea.      Past Surgical History:   Procedure Laterality Date    US BREAST FINE NEEDLE ASPIRATION          Social History     Socioeconomic History    Marital status: Married   Tobacco Use    Smoking status: Never    Smokeless tobacco: Never        No family history on file.     Allergies   Allergen Reactions    Aspirin     Ibuprofen     Penicillins         Current Outpatient Medications   Medication Sig Dispense Refill    fluticasone-salmeterol (ADVAIR HFA) 230-21 MCG/ACT inhaler Inhale 2 puffs into the lungs 2 times daily 1 each 5    fluticasone (FLONASE) 50 MCG/ACT nasal spray 2 sprays by Each Nostril route daily 16 g 0     No current facility-administered medications for this visit.        Review of  Systems:  HEENT: No epistaxis, no nasal drainage, no difficulty in swallowing, no redness in eyes  Respiratory: as above  Cardiovascular: no chest pain, no palpitations, no chronic leg edema, no syncope  Gastrointestinal: no abd pain, no vomiting, no diarrhea, no bleeding symptoms  Genitourinary: No urinary symptoms or hematuria  Integument/breast: No ulcers or rashes  Musculoskeletal:Neg  Neurological: No focal weakness, no seizures, no headaches  Behvioral/Psych: No anxiety, no depression  Constitutional: as above         No data to display                      No data to display                 Objective:   There were no vitals taken for this visit.     Physical Exam:   General: comfortable, no acute distress  HEENT: pupils reactive, sclera anicteric, EOM intact, moist mucus membranes.   Neck: No adenopathy or thyroid swelling, no lymphadenopathy or JVD, supple  CVS: S1S2 no murmurs  RS: CTA, no tactile fremitus or egophony, no accessory muscle use  Abd: soft, non tender, no hepatosplenomegaly, BS normal  Neuro: non focal, awake, alert  Extrm: no leg edema, clubbing or cyanosis  Skin: no rash on exposed skin    Data review:   Pertinent labs: CBC, BMP, LFT's - not available  Serologies (ILD, ANA): n/a  IgE: n/a  Peripheral Eos: 500  Allergy panel: n/a  Alpha-1 antitrypsin: n/a  Phenotype: n/a  ACE level: n/a  HP panel: n/a    PFT:  Date_____FEV1______FVC___FEV1/FVC___BDFEV1___BDFVC___pstBDratio__FEV1/SVC  02/2022....1.21(46%).....2.22(64%)...Marland KitchenMarland Kitchen55%......1.32(50%).....2.35(68%).....56%...................55%    Date_____TLC_________RV________RV/TLC  02/2022....4.9(87%).....2.72(126%)...Marland KitchenMarland Kitchen55%    Date_____DLCO  02/2022....24.36(99%)       Six Minute Walk Test: n/a    Echocardiogram  Date: No results found for this or any previous visit.      Imaging:  I have personally reviewed the patient's radiographs and have reviewed the reports:  XR Results (most recent):  N/A      CT Results (most recent):  N/A       There is no  problem list on file for this patient.      IMPRESSION:   Asthma: moderate, persistent. Suspect allergic subtype. PFT with moderate partially  reversible obstructive defect and gas trapping. No prior labwork.  Allergic rhinitis with post-nasal drip  H/o Aspirin & Ibuprofen allergy  H/o COVID-19 viral infection: Jan, 2024  Comorbid conditions: as above      RECOMMENDATIONS:   PFT discussed in clinic today  ***Start on Advair 231/21 mcg. 2 puff BID; rinse mouth after each use  Continue Albuterol rescue inhaler as needed  ***Stop Flovent  ***Start on Flonase nasal spray  Will obtain chest imaging upon re-evaluation  May continue on Allegra  Labwork - IgE, CBC w/ diff, and Allergen panel  May obtain chest imaging pending response to inhaler therapy.  Avoidance of triggers discussed  Vaccination: Recommend all age-appropriate immunizations including influenza, RSV and COVID-19 vaccination.  Follow up - 3 months & or sooner should new symptoms or problems arise.       Education:  Inhaler techniques, MDI/spacers, nebulizers use, goal setting, monitoring  Reducing anxiety, energy conservation tips, exercise, medications and Nutrition      Health maintenance screens deferred to Primary care provider.     Douglass Dunshee T Silvano Garofano, DO  07/21/2022  Pulmonary, Critical Care Medicine  [facility]

## 2022-07-21 NOTE — Patient Instructions (Addendum)
What is the plan?  -Start on Advair at reduced dose  -Continue Albuterol rescue inhaler as needed  -Follow-up with ENT as planned.  -Continue Flonase.  -Complete X-ray of your chest.

## 2022-07-21 NOTE — Progress Notes (Signed)
Kristina Crawford presents today for   Chief Complaint   Patient presents with    Moderate persistent asthma without complication       Is someone accompanying this pt? No    Is the patient using any DME equipment during OV? No    -DME Company No    Depression Screening:         No data to display                Learning Assessment:    Failed to redirect to the Timeline version of the REVFS SmartLink.    Abuse Screening:         No data to display                Fall Risk    Failed to redirect to the Timeline version of the REVFS SmartLink.    Coordination of Care:    1. Have you been to the ER, urgent care clinic since your last visit? Hospitalized since your last visit? No    2. Have you seen or consulted any other health care providers outside of the [facility] since your last visit? Include any pap smears or colon screening. No    Medication list has been update per patient.

## 2022-09-18 IMAGING — DX DG KNEE COMPLETE 4+V*R*
4 series · 4 of 4 positions shown · non-contrast
Comparison: None.

CLINICAL DATA: Right knee pain.  Tennis injury 2 months ago.

EXAM:
RIGHT KNEE - COMPLETE 4+ VIEW

[knee ap]
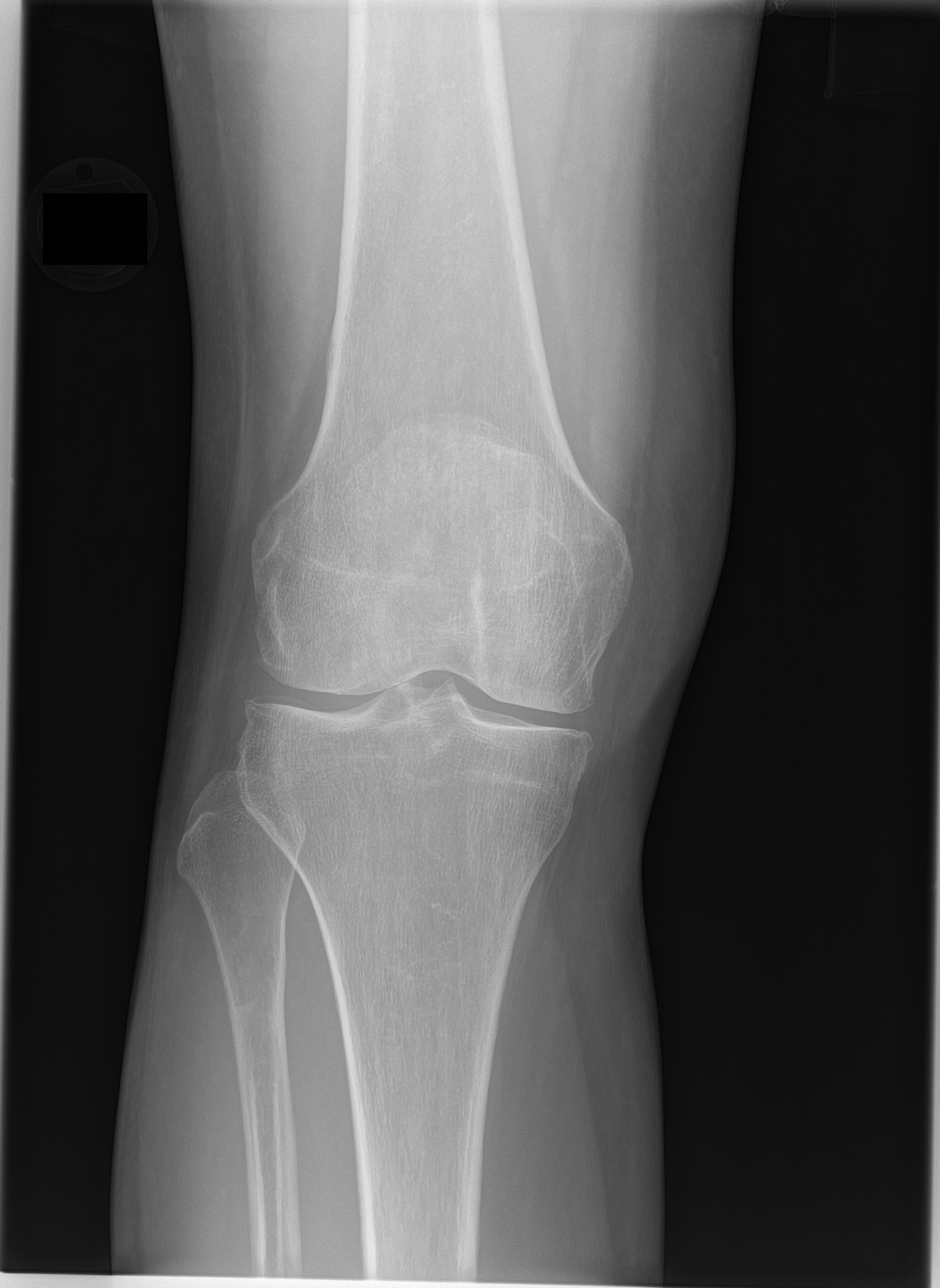

[knee obl (1 of 2)]
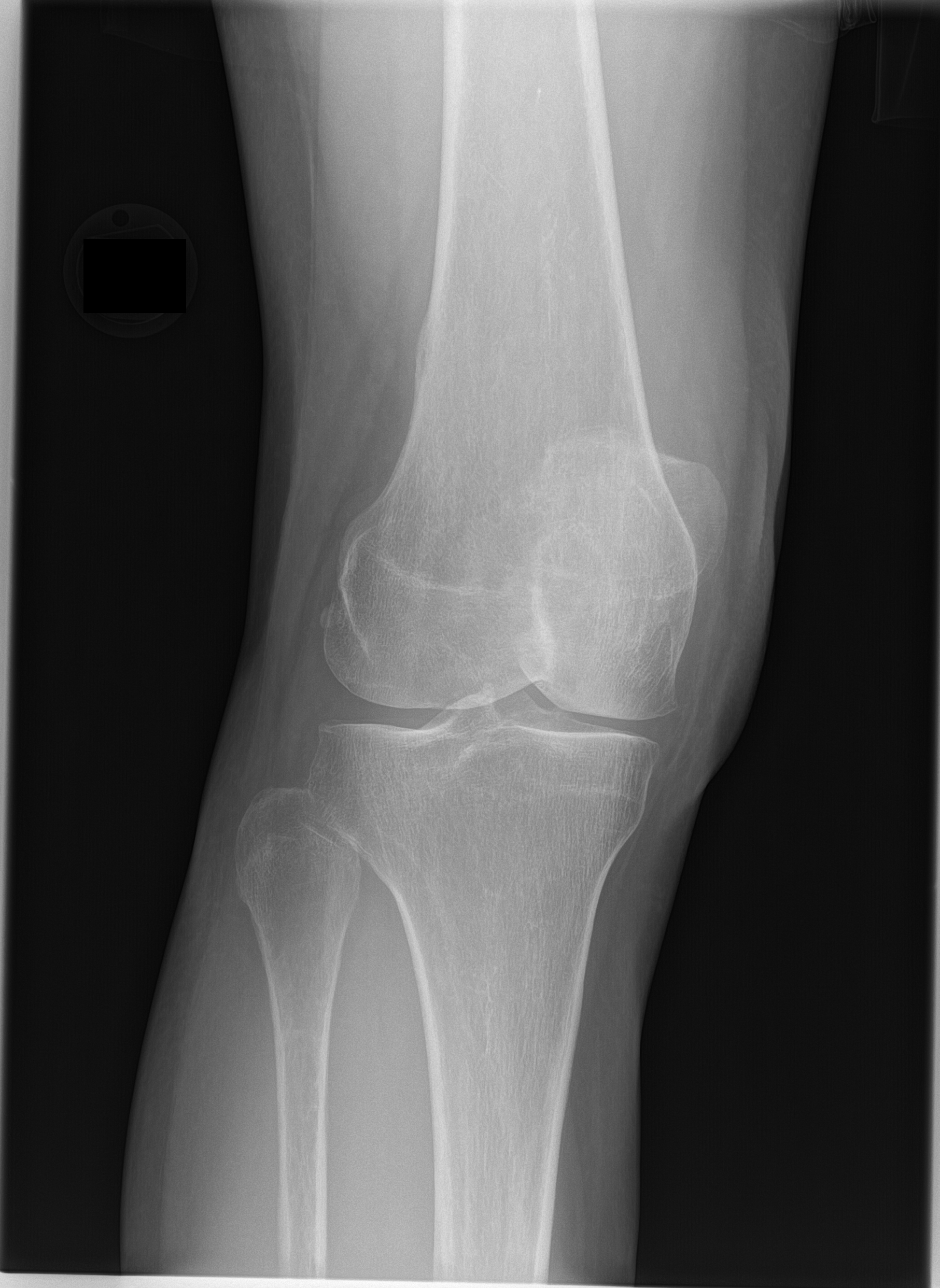

[knee obl (2 of 2)]
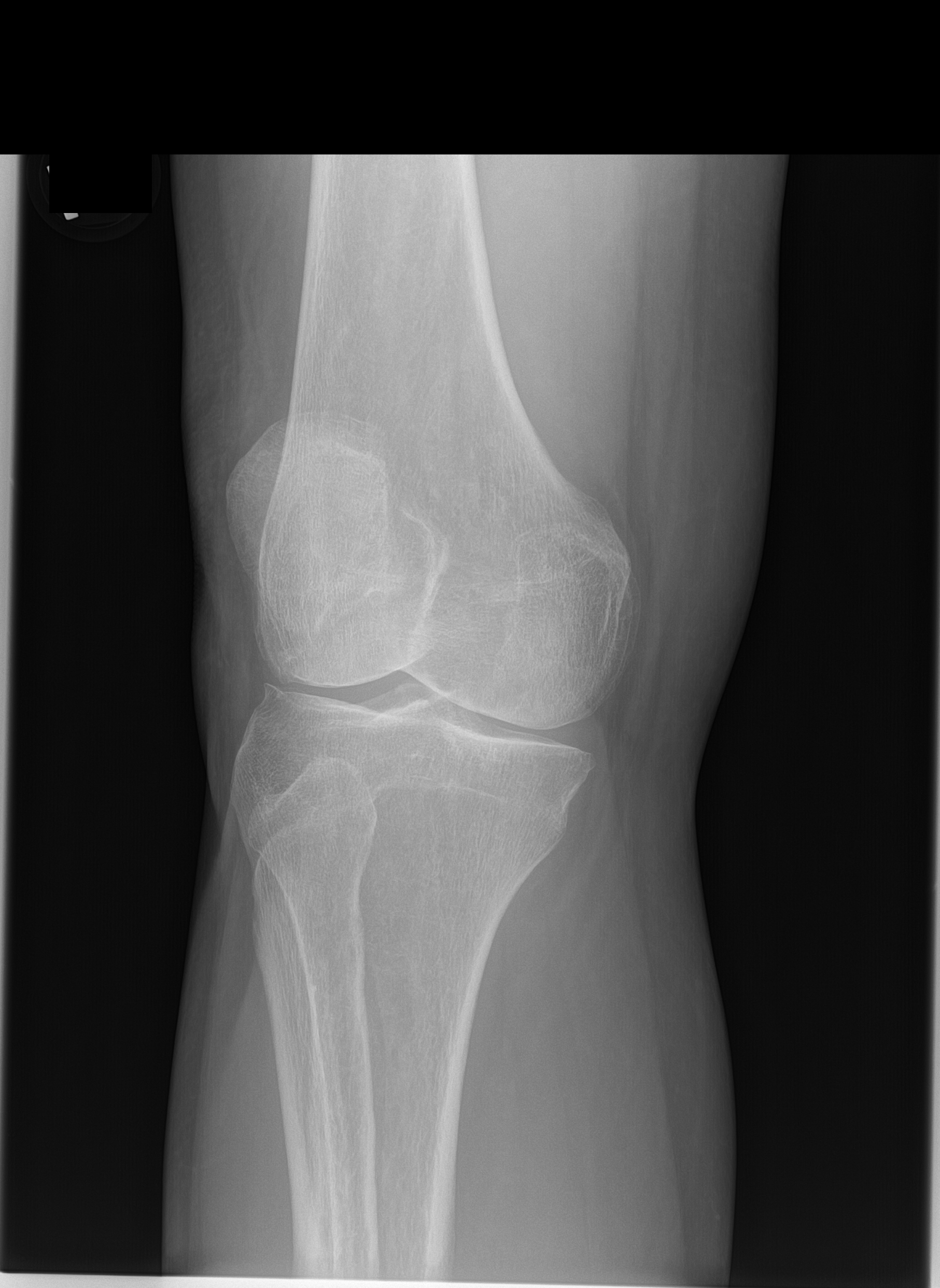

[knee lat]
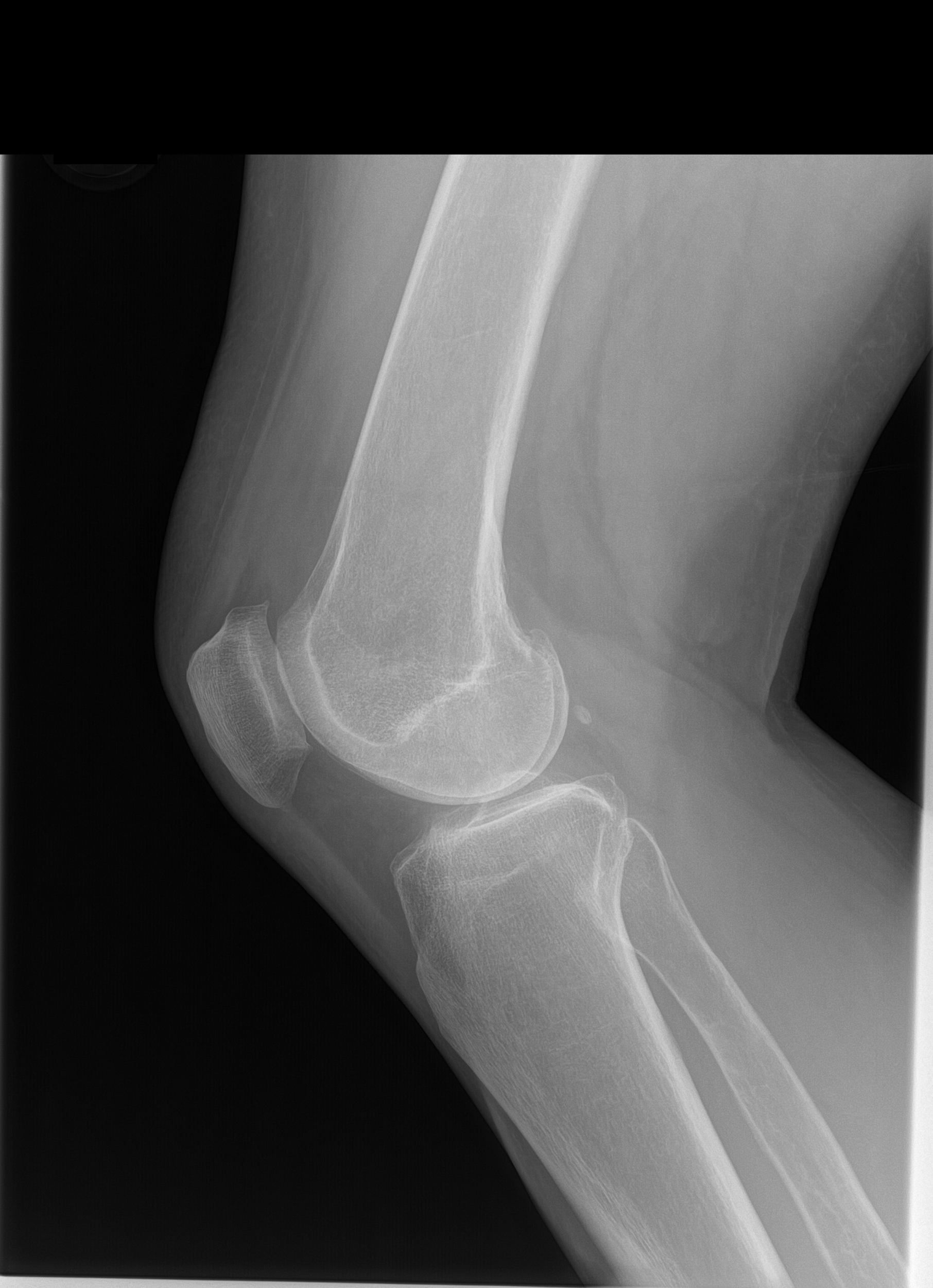

[4 of 4 positions shown; findings below may reference images not displayed]

FINDINGS: No fracture or joint effusion. Very mild tricompartmental
degenerative changes.
IMPRESSION: Mild tricompartmental degenerative changes.  No other abnormalities.

## 2023-06-08 ENCOUNTER — Encounter

## 2023-08-22 ENCOUNTER — Inpatient Hospital Stay: Admit: 2023-08-22 | Payer: MEDICARE | Primary: Student in an Organized Health Care Education/Training Program

## 2023-08-22 VITALS — Ht 66.0 in | Wt 155.0 lb

## 2023-08-22 DIAGNOSIS — Z1231 Encounter for screening mammogram for malignant neoplasm of breast: Secondary | ICD-10-CM

## 2023-09-20 ENCOUNTER — Ambulatory Visit
Admit: 2023-09-20 | Discharge: 2023-09-20 | Payer: MEDICARE | Primary: Student in an Organized Health Care Education/Training Program

## 2023-09-20 DIAGNOSIS — G8929 Other chronic pain: Secondary | ICD-10-CM

## 2023-09-20 DIAGNOSIS — M25511 Pain in right shoulder: Secondary | ICD-10-CM

## 2023-09-20 NOTE — Progress Notes (Signed)
 Kristina Crawford  10/20/1955   Chief Complaint   Patient presents with    Shoulder Pain     Rt         HISTORY OF PRESENT ILLNESS  Kristina Crawford is a 68 y.o. female who presents today for evaluation of right sided shoulder pain. Pain has been present since last fall. She feels she may have injured the shoulder while at the gym doing a shoulder press. Her PCP referred her to physical therapy which she completed in April. Therapy did help with mobility but not pain.  Pain is a 8/10.   Has tried following treatments: Injections:No; Brace:No; Therapy:yes; Cane/Crutch:No      Allergies   Allergen Reactions    Aspirin     Ibuprofen     Penicillins         No past medical history on file.   Social History       Tobacco History       Smoking Status  Never      Smokeless Tobacco Use  Never              Alcohol History       Alcohol Use Status  Not Asked              Drug Use       Drug Use Status  Not Asked              Sexual Activity       Sexually Active  Not Asked                   Past Surgical History:   Procedure Laterality Date    US  BREAST FINE NEEDLE ASPIRATION        No family history on file.  Current Outpatient Medications   Medication Sig    fluticasone  (FLONASE ) 50 MCG/ACT nasal spray 2 sprays by Each Nostril route daily    fluticasone -salmeterol (ADVAIR  HFA) 230-21 MCG/ACT inhaler Inhale 2 puffs into the lungs 2 times daily    fluticasone -salmeterol (ADVAIR  HFA) 115-21 MCG/ACT inhaler Inhale 2 puffs into the lungs 2 times daily     No current facility-administered medications for this visit.       REVIEW OF SYSTEM   Patient denies: Weight loss, Fever/Chills, HA, Visual changes, Fatigue, Chest pain, SOB, Abdominal pain, N/V/D/C, Blood in stool or urine, Edema.   Pertinent positive as above in HPI. All others were negative    PHYSICAL EXAM:   There were no vitals taken for this visit.  The patient is a well-developed, well-nourished female   in no acute distress.  The patient is alert and oriented  times three.  The patient is alert and oriented times three. Mood and affect are normal.  LYMPHATIC: lymph nodes are not enlarged and are within normal limits  SKIN: normal in color and non tender to palpation. There are no bruises or abrasions noted.   NEUROLOGICAL: Motor sensory exam is within normal limits. Reflexes are equal bilaterally. There is normal sensation to pinprick and light touch  MUSCULOSKELETAL:  Examination Right shoulder   Skin Intact   AC joint tenderness -   Biceps tenderness +   Forward flexion/Elevation ROM 180   Active abduction ROM 160   Glenohumeral abduction 90   External rotation ROM 90   Internal rotation ROM 70   Apprehension -   Jobe's Relocation -   Jerk -   Load and Shift -   O'briens -  Speeds -   Impingement sign -   Supraspinatus/Empty Can +, 5/5   External Rotation Strength -, 5/5   Lift Off/Belly Press -, 5/5   Neurovascular Intact        PROCEDURE: none    IMAGING: XR right shoulder 3 view obtained in the office 09/20/2023 and reviewed by myself reveals sclerotic changes at the greater tuberosity    IMPRESSION:      ICD-10-CM    1. Chronic right shoulder pain  M25.511 [[id number]] Shoulder 2V or more    G89.29            PLAN:   1. I believe she has some tearing of her rotator cuff but the majority of her pain is over the insertion of the biceps tendon. We discussed a cortisone injection today but she declined. We will obtain an MRI to evaluate for RTC tearing and possible biceps tendinopathy  Risk factors include: none  2. No cortisone injection indicated today  3. No Physical/Occupational Therapy indicated today  4. Yes diagnostic test indicated today   5. No durable medical equipment indicated today  6. No referral indicated today   7. No medications indicated today:   8. No Narcotic indicated today     RTC after MRI     A total of 20 minutes was spent with this patient in chart review, exam, obtaining/reviewing imaging, discussion of diagnosis/treatment and order entry. Over  half of the time was spent face-to-face with the patient.    Sabrina Crandall, ACNP-C  [place]  [facility]
